# Patient Record
Sex: Female | Born: 1995 | Race: Black or African American | Hispanic: No | Marital: Single | Smoking: Never smoker
Health system: Southern US, Community
[De-identification: ages and names within clinical notes are randomized; demographics above are authoritative.]

---
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
## 2016-05-22 ENCOUNTER — Encounter (HOSPITAL_COMMUNITY): Payer: Self-pay

## 2018-01-25 IMAGING — US US MFM OB FOLLOW-UP
1 series · 14 of 28 positions shown · non-contrast
Comparison: none

[Series 1: us mfm ob follow-up · 58 acquisitions, 14 frames shown]
[im 3/58]
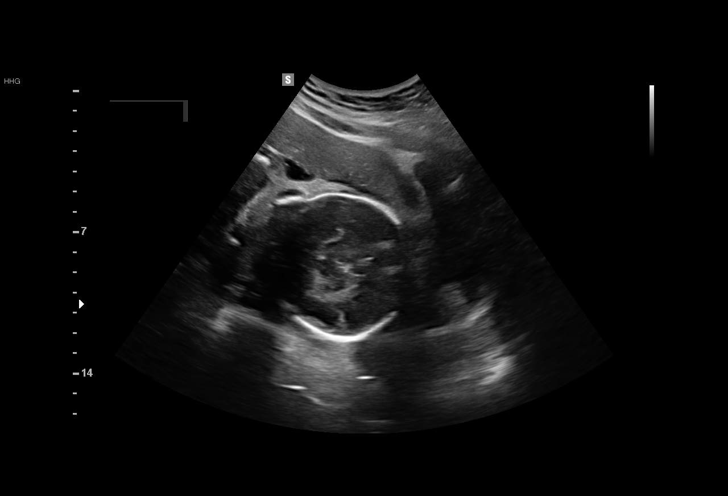
[im 7/58]
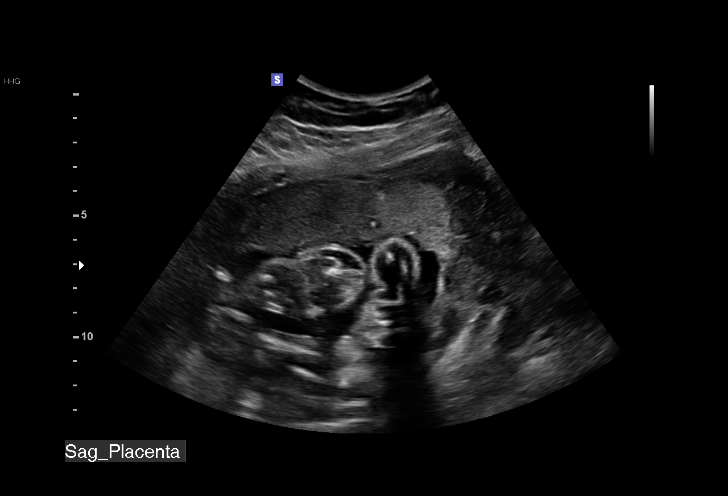
[im 11/58]
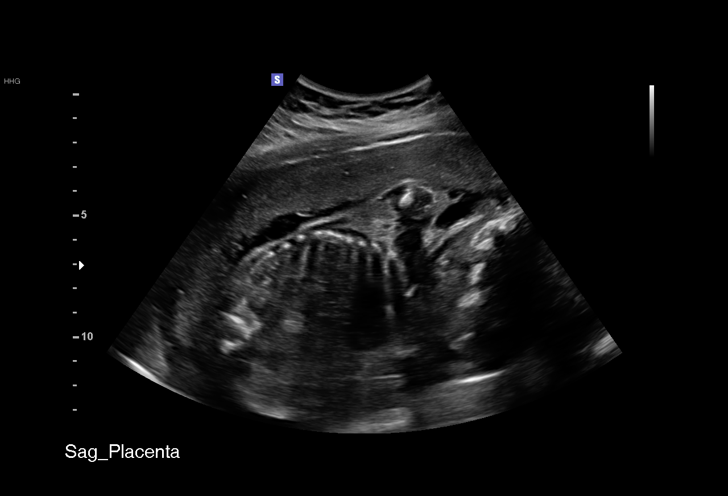
[im 15/58]
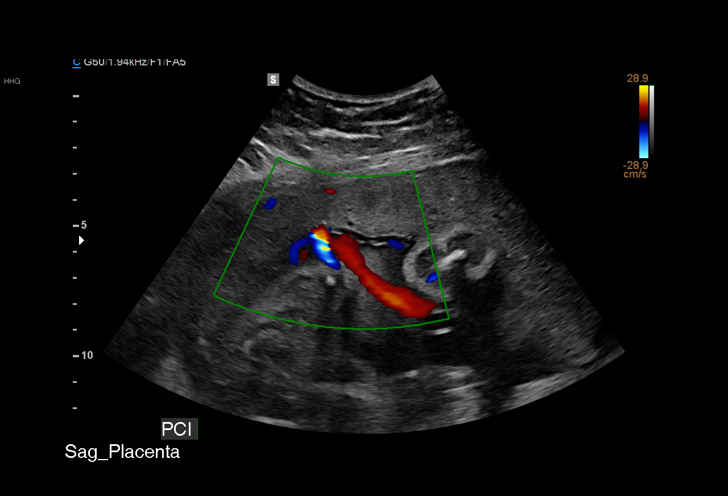
[im 20/58]
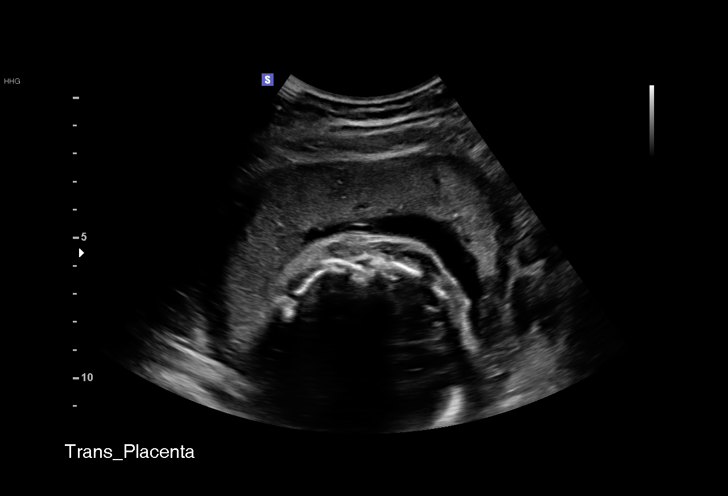
[im 24/58]
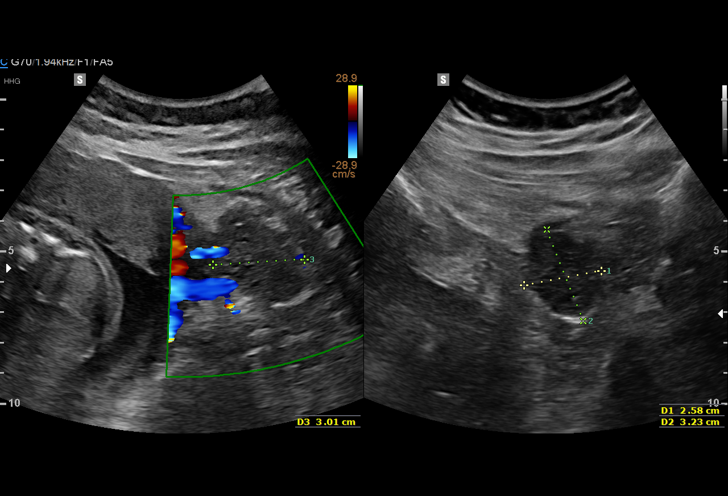
[im 28/58]
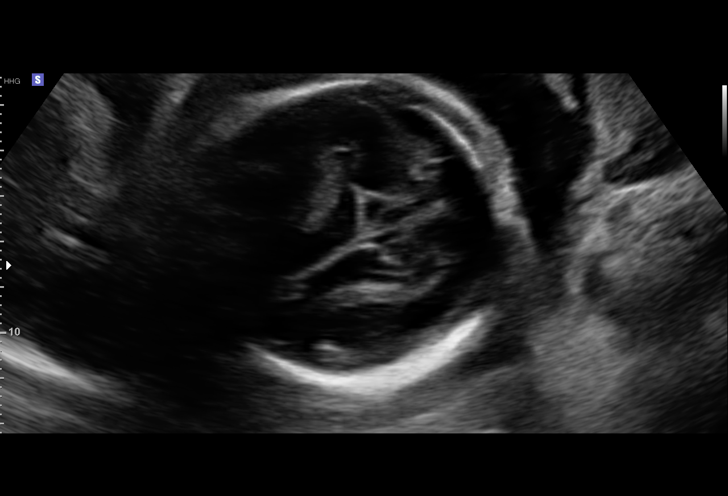
[im 32/58]
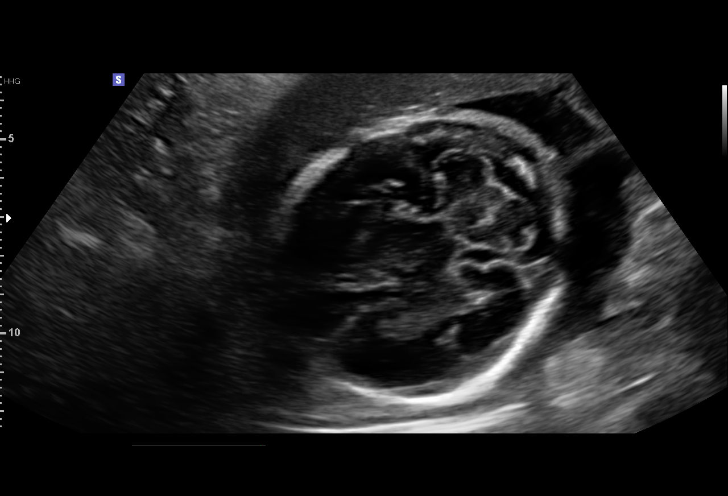
[im 36/58]
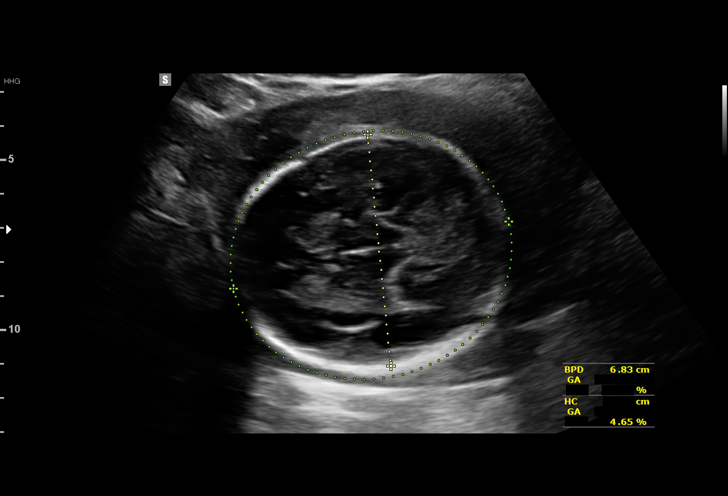
[im 41/58]
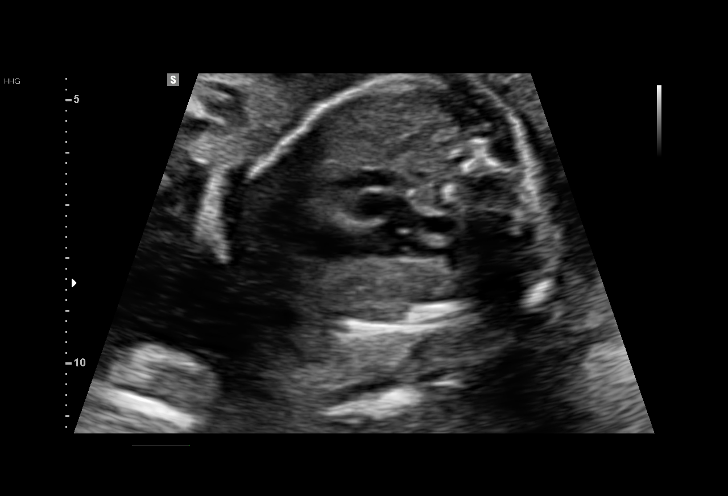
[im 45/58]
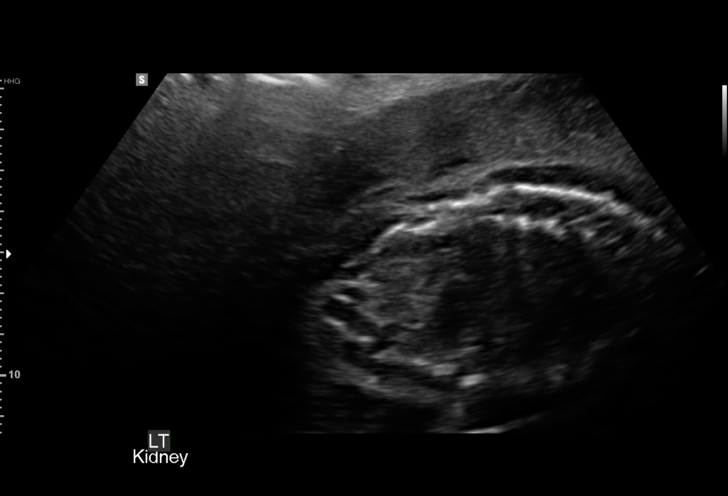
[im 49/58]
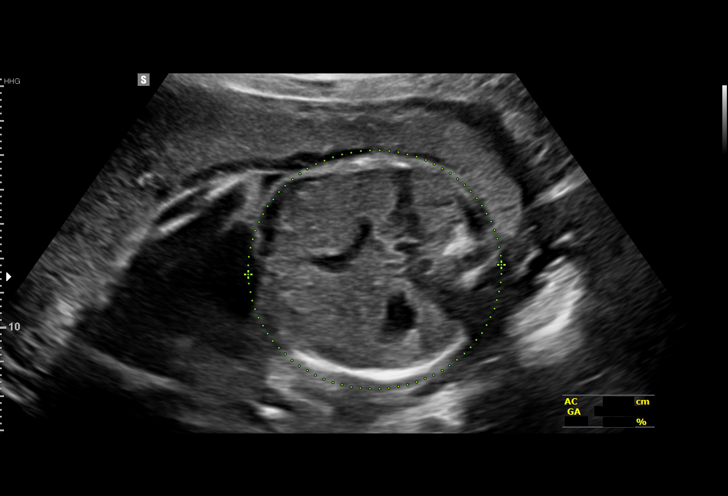
[im 53/58]
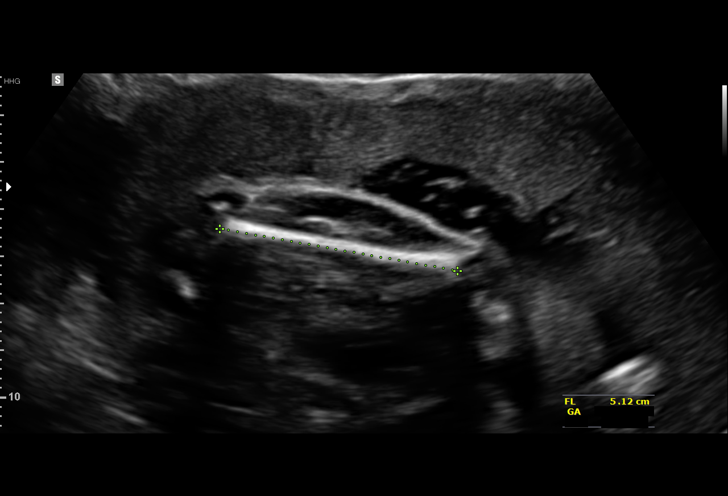
[im 58/58]
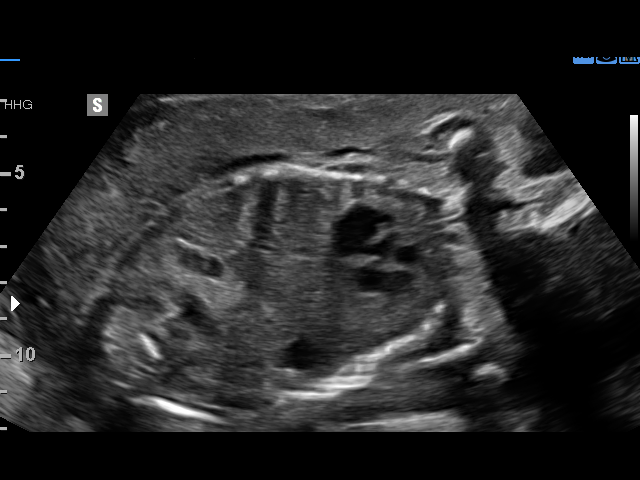

[14 of 28 positions shown; findings below may reference images not displayed]

[REDACTED],
[address]
[HOSPITAL], [place]
DEL[place] DO

1  TEJU MARTINEZZ            [id number]      [id number]     [id number]
Indications

27 weeks gestation of pregnancy
Abnormal biochemical screen (quad) for
Trisomy 21 ([DATE]) - declined further testing
High risk pregnancy with high inhibin          O23.9,
Herpes simplex virus (SCHNUFFI)
OB History

Gravidity:    1         Term:   0        Prem:   0        SAB:   0
TOP:          0       Ectopic:  0        Living: 0
Fetal Evaluation

Num Of Fetuses:     1
Fetal Heart         136
Rate(bpm):
Cardiac Activity:   Observed
Presentation:       Cephalic
Placenta:           Anterior, above cervical os

Amniotic Fluid
AFI FV:      Subjectively within normal limits
Larg Pckt:     5.3  cm
Biometry

BPD:      68.8  mm     G. Age:  27w 4d                  CI:        79.14   %    70 - 86
FL/HC:      21.1   %    18.8 -
HC:      244.5  mm     G. Age:  26w 4d          5  %    HC/AC:      1.09        1.05 -
AC:      223.5  mm     G. Age:  26w 5d         20  %    FL/BPD:     75.0   %    71 - 87
FL:       51.6  mm     G. Age:  27w 4d         35  %    FL/AC:      23.1   %    20 - 24
HUM:      46.8  mm     G. Age:  27w 4d         46  %
Est. FW:    9075  gm      2 lb 4 oz     41  %
Gestational Age

LMP:           28w 4d        Date:  03/10/15                 EDD:   12/15/15
U/S Today:     27w 1d                                        EDD:   12/25/15
Best:          27w 4d     Det. By:  U/S  (07/18/15)          EDD:   12/22/15
Anatomy

Cranium:          Appears normal         Aortic Arch:      Previously seen
Fetal Cavum:      Appears normal         Ductal Arch:      Previously seen
Ventricles:       Appears normal         Diaphragm:        Appears normal
Choroid Plexus:   Appears normal         Stomach:          Appears normal, left
sided
Cerebellum:       Appears normal         Abdomen:          Appears normal
Posterior Fossa:  Appears normal         Abdominal Wall:   Previously seen
Nuchal Fold:      Not applicable (>20    Cord Vessels:     Previously seen
wks GA)
Face:             Orbits and profile     Kidneys:          Appear normal
previously seen
Lips:             Previously seen        Bladder:          Appears normal
Fetal Thoracic:   Appears normal         Spine:            Limited views
previously seen
Heart:            Appears normal         Upper             Previously seen
(4CH, axis, and situs  Extremities:
RVOT:             Appears normal         Lower             Previously seen
Extremities:
LVOT:             Appears normal

Other:  Fetus appears to be a female. Nasal bone previously seen.
Technically difficult due to advanced GA and fetal position.
Cervix Uterus Adnexa

Cervix
Length:            3.9  cm.
Normal appearance by transabdominal scan.

Uterus
No abnormality visualized.

Left Ovary
Size(cm)       2.6  x    3     x  3.2       Vol(ml):
Within normal limits.

Right Ovary
Not visualized.

Adnexa:       No adnexal mass visualized.
Impression

SIUP at 27+4 weeks, elevated HCG/inhibin (patient declines
additional testing)
Normal interval anatomy; anatomic survey complete
Normal amniotic fluid volume
Appropriate interval growth with EFW at the 41st %tile
Recommendations

Interval growth in 6-8 weeks.

## 2022-11-03 DIAGNOSIS — X58XXXA Exposure to other specified factors, initial encounter: Secondary | ICD-10-CM | POA: Diagnosis not present

## 2022-11-03 DIAGNOSIS — R451 Restlessness and agitation: Secondary | ICD-10-CM | POA: Diagnosis not present

## 2022-11-03 DIAGNOSIS — R40241 Glasgow coma scale score 13-15, unspecified time: Secondary | ICD-10-CM | POA: Diagnosis not present

## 2022-11-03 DIAGNOSIS — T403X1A Poisoning by methadone, accidental (unintentional), initial encounter: Secondary | ICD-10-CM | POA: Diagnosis not present

## 2022-11-04 DIAGNOSIS — F1920 Other psychoactive substance dependence, uncomplicated: Secondary | ICD-10-CM | POA: Diagnosis not present

## 2022-11-04 DIAGNOSIS — R4182 Altered mental status, unspecified: Secondary | ICD-10-CM | POA: Diagnosis not present

## 2022-11-05 DIAGNOSIS — Z5989 Other problems related to housing and economic circumstances: Secondary | ICD-10-CM | POA: Diagnosis not present

## 2022-11-05 DIAGNOSIS — F17210 Nicotine dependence, cigarettes, uncomplicated: Secondary | ICD-10-CM | POA: Diagnosis not present

## 2022-11-05 DIAGNOSIS — Z79899 Other long term (current) drug therapy: Secondary | ICD-10-CM | POA: Diagnosis not present

## 2022-11-05 DIAGNOSIS — Z608 Other problems related to social environment: Secondary | ICD-10-CM | POA: Diagnosis not present

## 2022-11-05 DIAGNOSIS — Z1152 Encounter for screening for COVID-19: Secondary | ICD-10-CM | POA: Diagnosis not present

## 2022-11-05 DIAGNOSIS — F1123 Opioid dependence with withdrawal: Secondary | ICD-10-CM | POA: Diagnosis not present

## 2022-11-05 DIAGNOSIS — Z653 Problems related to other legal circumstances: Secondary | ICD-10-CM | POA: Diagnosis not present

## 2022-11-05 DIAGNOSIS — F1420 Cocaine dependence, uncomplicated: Secondary | ICD-10-CM | POA: Diagnosis not present

## 2022-11-05 DIAGNOSIS — F1520 Other stimulant dependence, uncomplicated: Secondary | ICD-10-CM | POA: Diagnosis not present

## 2022-11-05 DIAGNOSIS — F1220 Cannabis dependence, uncomplicated: Secondary | ICD-10-CM | POA: Diagnosis not present

## 2022-11-07 DIAGNOSIS — F1120 Opioid dependence, uncomplicated: Secondary | ICD-10-CM | POA: Diagnosis not present

## 2023-01-07 DIAGNOSIS — F1590 Other stimulant use, unspecified, uncomplicated: Secondary | ICD-10-CM | POA: Diagnosis not present

## 2023-01-07 DIAGNOSIS — F1510 Other stimulant abuse, uncomplicated: Secondary | ICD-10-CM | POA: Diagnosis not present

## 2023-03-04 DIAGNOSIS — R42 Dizziness and giddiness: Secondary | ICD-10-CM | POA: Diagnosis not present

## 2023-03-04 DIAGNOSIS — R0989 Other specified symptoms and signs involving the circulatory and respiratory systems: Secondary | ICD-10-CM | POA: Diagnosis not present

## 2023-03-04 DIAGNOSIS — Z1152 Encounter for screening for COVID-19: Secondary | ICD-10-CM | POA: Diagnosis not present

## 2023-03-04 DIAGNOSIS — R059 Cough, unspecified: Secondary | ICD-10-CM | POA: Diagnosis not present

## 2023-03-04 DIAGNOSIS — Z5321 Procedure and treatment not carried out due to patient leaving prior to being seen by health care provider: Secondary | ICD-10-CM | POA: Diagnosis not present
